# Patient Record
Sex: Male | Born: 2000 | Race: Black or African American | Hispanic: No | Marital: Single | Smoking: Never smoker
Health system: Southern US, Community
[De-identification: ages and names within clinical notes are randomized; demographics above are authoritative.]

## PROBLEM LIST (undated history)

## (undated) DIAGNOSIS — E119 Type 2 diabetes mellitus without complications: Secondary | ICD-10-CM

## (undated) DIAGNOSIS — S0502XA Injury of conjunctiva and corneal abrasion without foreign body, left eye, initial encounter: Secondary | ICD-10-CM

## (undated) DIAGNOSIS — F329 Major depressive disorder, single episode, unspecified: Secondary | ICD-10-CM

## (undated) DIAGNOSIS — H109 Unspecified conjunctivitis: Secondary | ICD-10-CM

## (undated) DIAGNOSIS — F419 Anxiety disorder, unspecified: Principal | ICD-10-CM

## (undated) DIAGNOSIS — R51 Headache: Secondary | ICD-10-CM

## (undated) DIAGNOSIS — Z973 Presence of spectacles and contact lenses: Secondary | ICD-10-CM

## (undated) HISTORY — DX: Unspecified conjunctivitis: H10.9

## (undated) HISTORY — DX: Major depressive disorder, single episode, unspecified: F32.9

## (undated) HISTORY — DX: Headache: R51

## (undated) HISTORY — DX: Injury of conjunctiva and corneal abrasion without foreign body, left eye, initial encounter: S05.02XA

## (undated) HISTORY — DX: Presence of spectacles and contact lenses: Z97.3

## (undated) HISTORY — DX: Anxiety disorder, unspecified: F41.9

## (undated) HISTORY — PX: NO PAST SURGERIES: SHX2092

---
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
## 2020-05-25 ENCOUNTER — Telehealth: Payer: Self-pay | Admitting: Physician Assistant

## 2020-05-25 NOTE — Telephone Encounter (Signed)
I connected by phone with Marco Soto and/or patient's caregiver on 05/25/2020 at 4:48 PM to discuss the potential vaccination through our Homebound vaccination initiative.   Prevaccination Checklist for COVID-19 Vaccines  1.  Are you feeling sick today? No  2.  Have you ever received a dose of a COVID-19 vaccine?  no      If yes, which one? None   3.  Have you ever had an allergic reaction: (This would include a severe reaction [ e.g., anaphylaxis] that required treatment with epinephrine or EpiPen or that caused you to go to the hospital.  It would also include an allergic reaction that occurred within 4 hours that caused hives, swelling, or respiratory distress, including wheezing.) A.  A previous dose of COVID-19 vaccine. no  B.  A vaccine or injectable therapy that contains multiple components, one of which is a COVID-19 vaccine component, but it is not known which component elicited the immediate reaction. no  C.  Are you allergic to polyethylene glycol? no  D. Are you allergic to Polysorbate, which is found in some vaccines, film coated tablets and intravenous steroids?  no   4.  Have you ever had an allergic reaction to another vaccine (other than COVID-19 vaccine) or an injectable medication? (This would include a severe reaction [ e.g., anaphylaxis] that required treatment with epinephrine or EpiPen or that caused you to go to the hospital.  It would also include an allergic reaction that occurred within 4 hours that caused hives, swelling, or respiratory distress, including wheezing.)  no   5.  Have you ever had a severe allergic reaction (e.g., anaphylaxis) to something other than a component of the COVID-19 vaccine, or any vaccine or injectable medication?  This would include food, pet, venom, environmental, or oral medication allergies.  no   6.  Have you received any vaccine in the last 14 days? no   7.  Have you ever had a positive test for COVID-19 or has a doctor ever told  you that you had COVID-19?  no   8.  Have you received passive antibody therapy (monoclonal antibodies or convalescent serum) as a treatment for COVID-19? no   9.  Do you have a weakened immune system caused by something such as HIV infection or cancer or do you take immunosuppressive drugs or therapies?  no   10.  Do you have a bleeding disorder or are you taking a blood thinner? no   11.  Are you pregnant or breast-feeding? no   12.  Do you have dermal fillers? no   __________________   This patient is a 19 y.o. male that meets the FDA criteria to receive homebound vaccination. Patient or parent/caregiver understands they have the option to accept or refuse homebound vaccination.  Patient passed the pre-screening checklist and would like to proceed with homebound vaccination.  Based on questionnaire above, I recommend the patient be observed for 15 minutes.  There are an estimated one other household members/caregivers who are also interested in receiving the vaccine.   I will send the patient's information to our scheduling team who will reach out to schedule the patient and potential caregiver/family members for homebound vaccination.   Cline Crock PA-C 05/25/2020 4:48 PM

## 2020-06-11 ENCOUNTER — Ambulatory Visit: Payer: Federal, State, Local not specified - PPO | Attending: Critical Care Medicine

## 2020-06-11 DIAGNOSIS — Z23 Encounter for immunization: Secondary | ICD-10-CM

## 2020-06-11 NOTE — Progress Notes (Signed)
   Covid-19 Vaccination Clinic  Name:  Marco Soto    MRN: [id number] DOB: 11/10/2001  06/11/2020  Marco Soto was observed post Covid-19 immunization for 15 minutes without incident. He was provided with Vaccine Information Sheet and instruction to access the V-Safe system.   Marco Soto was instructed to call 911 with any severe reactions post vaccine: Marland Kitchen Difficulty breathing  . Swelling of face and throat  . A fast heartbeat  . A bad rash all over body  . Dizziness and weakness   Immunizations Administered    Name Date Dose VIS Date Route   Pfizer COVID-19 Vaccine 06/11/2020 11:00 AM 0.3 mL 01/17/2019 Intramuscular   Manufacturer: [employer], [employer]   Lot: [id number]   NDC: [address]-9

## 2020-07-02 ENCOUNTER — Ambulatory Visit: Payer: Federal, State, Local not specified - PPO

## 2020-07-09 ENCOUNTER — Ambulatory Visit: Payer: Federal, State, Local not specified - PPO | Attending: Critical Care Medicine

## 2020-07-09 DIAGNOSIS — Z23 Encounter for immunization: Secondary | ICD-10-CM

## 2020-07-09 NOTE — Progress Notes (Signed)
   Covid-19 Vaccination Clinic  Name:  Marco Soto    MRN: [id number] DOB: 25-May-2001  07/09/2020  [name] was observed post Covid-19 immunization for 15 minutes without incident. He was provided with Vaccine Information Sheet and instruction to access the V-Safe system.   [name] was instructed to call 911 with any severe reactions post vaccine: Marland Kitchen Difficulty breathing  . Swelling of face and throat  . A fast heartbeat  . A bad rash all over body  . Dizziness and weakness   Immunizations Administered    Name Date Dose VIS Date Route   Pfizer COVID-19 Vaccine 07/09/2020 11:18 AM 0.3 mL 01/17/2019 Intramuscular   Manufacturer: [employer], [employer]   Lot: [id number]   NDC: [address]-5

## 2021-01-09 ENCOUNTER — Ambulatory Visit: Payer: Federal, State, Local not specified - PPO | Attending: Internal Medicine

## 2021-01-09 DIAGNOSIS — Z23 Encounter for immunization: Secondary | ICD-10-CM

## 2021-01-09 NOTE — Progress Notes (Signed)
   Covid-19 Vaccination Clinic  Name:  Marco Soto    MRN: [id number] DOB: 06/12/2001  01/09/2021  Mr. [name] was observed post Covid-19 immunization for 15 minutes without incident. He was provided with Vaccine Information Sheet and instruction to access the V-Safe system.   Mr. [name] was instructed to call 911 with any severe reactions post vaccine: Marland Kitchen Difficulty breathing  . Swelling of face and throat  . A fast heartbeat  . A bad rash all over body  . Dizziness and weakness   Immunizations Administered    Name Date Dose VIS Date Route   PFIZER Comrnaty(Gray TOP) Covid-19 Vaccine 01/09/2021 11:15 AM 0.3 mL 10/31/2020 Intramuscular   Manufacturer: [employer], [employer]   Lot: [id number]   NDC: [id number]

## 2024-06-16 ENCOUNTER — Encounter (HOSPITAL_BASED_OUTPATIENT_CLINIC_OR_DEPARTMENT_OTHER): Payer: Self-pay

## 2024-06-16 ENCOUNTER — Emergency Department (HOSPITAL_BASED_OUTPATIENT_CLINIC_OR_DEPARTMENT_OTHER)
Admission: EM | Admit: 2024-06-16 | Discharge: 2024-06-17 | Disposition: A | Discharge status: Home / Self Care | Attending: Emergency Medicine | Admitting: Emergency Medicine

## 2024-06-16 ENCOUNTER — Other Ambulatory Visit: Payer: Self-pay

## 2024-06-16 DIAGNOSIS — M542 Cervicalgia: Secondary | ICD-10-CM | POA: Diagnosis present

## 2024-06-16 DIAGNOSIS — M62838 Other muscle spasm: Secondary | ICD-10-CM | POA: Insufficient documentation

## 2024-06-16 HISTORY — DX: Type 2 diabetes mellitus without complications: E11.9

## 2024-06-16 NOTE — ED Triage Notes (Signed)
 Pt states he was laughing around 2pm then he started having pain in the left side of his neck. Pt states his throat does feel sore now but this feels like muscle not sore throat. Denies injury or any other s/s at time of triage.

## 2024-06-17 NOTE — ED Provider Notes (Signed)
 [place] [facility] AT [facility] Provider Note   CSN: [id number] Arrival date & time: 06/16/24  2252     Patient presents with: Neck Pain   Marco Soto is a 23 y.o. male.   The history is provided by the patient.  Neck Pain Marco Soto is a 23 y.o. male who presents to the Emergency Department complaining of neck pain.  He presents to the emergency department for evaluation of left anterior lateral neck pain that started around 2 PM when he laughs.  He describes it as a discomfort and soreness.  Symptoms noticeable when he swallows or moves his neck.  At time of ED assessment his symptoms had completely resolved.  He has experienced prior episodes in the past triggered by laughter, stretching but they have not lasted as long.  No difficulty swallowing, difficulty breathing, sensation of throat swelling.  No recent illnesses.  He has no known medical problems and takes no medications.   No tobacco, occasional alcohol. No drugs.       Prior to Admission medications   Medication Sig Start Date End Date Taking? Authorizing Provider  zolpidem  (AMBIEN ) 5 MG tablet Take 1 tablet (5 mg total) by mouth at bedtime as needed for sleep. Patient not taking: Reported on 03/15/2015 12/28/13   Paz, Jose E, MD    Allergies: Patient has no known allergies.    Review of Systems  Musculoskeletal:  Positive for neck pain.  All other systems reviewed and are negative.   Updated Vital Signs BP 125/85 (BP Location: Left Arm)   Pulse 88   Temp 98 F (36.7 C) (Oral)   Resp 17   Ht 6' (1.829 m)   Wt 113.4 kg   SpO2 98%   BMI 33.91 kg/m   Physical Exam Vitals and nursing note reviewed.  Constitutional:      Appearance: Normal appearance.  HENT:     Head: Normocephalic and atraumatic.     Nose: Nose normal.     Mouth/Throat:     Mouth: Mucous membranes are moist.     Pharynx: No oropharyngeal exudate or posterior oropharyngeal erythema.  Eyes:      Extraocular Movements: Extraocular movements intact.     Pupils: Pupils are equal, round, and reactive to light.  Cardiovascular:     Rate and Rhythm: Normal rate and regular rhythm.     Heart sounds: Normal heart sounds.  Pulmonary:     Effort: Pulmonary effort is normal. No respiratory distress.     Breath sounds: Normal breath sounds.  Musculoskeletal:        General: No swelling.     Cervical back: Normal range of motion and neck supple. No tenderness.  Lymphadenopathy:     Cervical: No cervical adenopathy.  Skin:    General: Skin is warm and dry.     Capillary Refill: Capillary refill takes less than 2 seconds.  Neurological:     Mental Status: He is alert and oriented to person, place, and time.     Comments: No asymmetry of facial movements     (all labs ordered are listed, but only abnormal results are displayed) Labs Reviewed - No data to display  EKG: None  Radiology: No results found.   Procedures   Medications Ordered in the ED - No data to display  Medical Decision Making  Patient here for evaluation of left anterior neck discomfort after laughing.  No soft tissue masses, thyroid masses or swelling on examination.  No evidence of soft tissue infection.  No respiratory symptoms.  Suspect this was transient muscle spasm/irritation.  Feel he is stable for discharge home.  Recommend ibuprofen , warm compress if symptoms recur.  Discussed return precautions for progressive or new concerning symptoms.     Final diagnoses:  Muscle spasms of neck    ED Discharge Orders     None          Griselda Norris, MD 06/17/24 [phone]
